# Patient Record
Sex: Female | Born: 1995 | Race: White | Hispanic: No | Marital: Single
Health system: Southern US, Community
[De-identification: ages and names within clinical notes are randomized; demographics above are authoritative.]

---

## 2021-02-20 ENCOUNTER — Other Ambulatory Visit: Payer: Self-pay

## 2021-02-20 ENCOUNTER — Emergency Department: Payer: BC Managed Care – PPO

## 2021-02-20 ENCOUNTER — Emergency Department
Admission: EM | Admit: 2021-02-20 | Discharge: 2021-02-20 | Disposition: A | Discharge status: Home / Self Care | Payer: BC Managed Care – PPO | Attending: Emergency Medicine | Admitting: Emergency Medicine

## 2021-02-20 DIAGNOSIS — S0990XA Unspecified injury of head, initial encounter: Secondary | ICD-10-CM | POA: Diagnosis present

## 2021-02-20 NOTE — Discharge Instructions (Addendum)
No acute findings on CT of the head and cervical spine.  Read and follow discharge care instruction for minor head injury.  Take Tylenol only for headache/pain.  Return right ED if condition worsens.

## 2021-02-20 NOTE — ED Provider Notes (Signed)
[facility] Emergency Department Provider Note   ____________________________________________   Event Date/Time   First MD Initiated Contact with Patient 02/20/21 1301     (approximate)  I have reviewed the triage vital signs and the nursing notes.   HISTORY  Chief Complaint Head Injury    HPI Veronica Craig is a 25 y.o. female patient presents with head injury after falling off a horse.  Witnessed by supervisors stated there was a brief episode of LOC.  Patient was confused initially after the fall.  Patient states now feels fine except for headache.  Denies vision disturbance or vertigo.  Patient landed on her side turned to her back when she fell off the horse.  Rates the pain as a 2/10.  Describes the pain as "sore".  No palliative measure for complaint.         History reviewed. No pertinent past medical history.  There are no problems to display for this patient.     Prior to Admission medications   Not on File    Allergies Patient has no known allergies.  History reviewed. No pertinent family history.  Social History    Review of Systems  Constitutional: No fever/chills Eyes: No visual changes. ENT: No sore throat. Cardiovascular: Denies chest pain. Respiratory: Denies shortness of breath. Gastrointestinal: No abdominal pain.  No nausea, no vomiting.  No diarrhea.  No constipation. Genitourinary: Negative for dysuria. Musculoskeletal: Negative for back pain. Skin: Negative for rash. Neurological: Positive for headaches, but denies focal weakness or numbness.   ____________________________________________   PHYSICAL EXAM:  VITAL SIGNS: ED Triage Vitals  Enc Vitals Group     BP 02/20/21 1155 112/84     Pulse Rate 02/20/21 1153 76     Resp 02/20/21 1155 18     Temp 02/20/21 1153 98.6 F (37 C)     Temp Source 02/20/21 1153 Oral     SpO2 02/20/21 1155 100 %     Weight 02/20/21 1154 145 lb (65.8 kg)     Height  02/20/21 1154 5\' 5"  (1.651 m)     Head Circumference --      Peak Flow --      Pain Score 02/20/21 1153 2     Pain Loc --      Pain Edu? --      Excl. in GC? --     Constitutional: Alert and oriented. Well appearing and in no acute distress. Eyes: Conjunctivae are normal. PERRL. EOMI. Head: Atraumatic. Nose: No congestion/rhinnorhea. Mouth/Throat: Mucous membranes are moist.  Oropharynx non-erythematous. Neck: No stridor.  No cervical spine tenderness to palpation. Hematological/Lymphatic/Immunilogical: No cervical lymphadenopathy. Cardiovascular: Normal rate, regular rhythm. Grossly normal heart sounds.  Good peripheral circulation. Respiratory: Normal respiratory effort.  No retractions. Lungs CTAB. Gastrointestinal: Soft and nontender. No distention. No abdominal bruits. No CVA tenderness. Genitourinary: Deferred Musculoskeletal: No lower extremity tenderness nor edema.  No joint effusions. Neurologic:  Normal speech and language. No gross focal neurologic deficits are appreciated. No gait instability. Skin:  Skin is warm, dry and intact. No rash noted.  No abrasion or ecchymosis. Psychiatric: Mood and affect are normal. Speech and behavior are normal.  ____________________________________________   LABS (all labs ordered are listed, but only abnormal results are displayed)  Labs Reviewed - No data to display ____________________________________________  EKG   ____________________________________________  RADIOLOGY I, 04/22/21, personally viewed and evaluated these images (plain radiographs) as part of my medical decision making, as well as reviewing  the written report by the radiologist.  ED MD interpretation:    Official radiology report(s): CT HEAD WO CONTRAST ( )  Result Date: 02/20/2021 CLINICAL DATA:  Polytrauma, critical, head/C-spine injury suspected EXAM: CT HEAD WITHOUT CONTRAST TECHNIQUE: Contiguous axial images were obtained from the base of the  skull through the vertex without intravenous contrast. COMPARISON:  None. FINDINGS: Brain: No evidence of acute intracranial hemorrhage or extra-axial collection.No evidence of mass lesion/concern mass effect.The ventricles are normal in size. Vascular: No hyperdense vessel or unexpected calcification. Skull: Normal. Negative for fracture or focal lesion. Sinuses/Orbits: No acute finding. Other: None. IMPRESSION: No acute intracranial abnormality. Electronically Signed   By: Caprice Renshaw M.D.   On: 02/20/2021 14:04   CT Cervical Spine Wo Contrast  Result Date: 02/20/2021 CLINICAL DATA:  Polytrauma, critical, head/C-spine injury suspected EXAM: CT CERVICAL SPINE WITHOUT CONTRAST TECHNIQUE: Multidetector CT imaging of the cervical spine was performed without intravenous contrast. Multiplanar CT image reconstructions were also generated. COMPARISON:  None. FINDINGS: Alignment: Normal. Skull base and vertebrae: No acute fracture. No primary bone lesion or focal pathologic process. Soft tissues and spinal canal: No prevertebral fluid or swelling. No visible canal hematoma. Disc levels: Preserved disc spaces.No large disc herniation, significant spinal canal or neuroforaminal narrowing. Upper chest: Negative. Other: None. IMPRESSION: No acute cervical spine fracture. Electronically Signed   By: Caprice Renshaw M.D.   On: 02/20/2021 14:08    ____________________________________________   PROCEDURES  Procedure(s) performed (including Critical Care):  Procedures   ____________________________________________   INITIAL IMPRESSION / ASSESSMENT AND PLAN / ED COURSE  As part of my medical decision making, I reviewed the following data within the electronic MEDICAL RECORD NUMBER         Patient presents evaluation secondary to fall from horse for brief episode of LOC.  Discussed no acute findings on CT of the head and cervical spine.  Patient complaint physical exam consistent with minor head injury secondary to  fall.  Patient given discharge care instructions and advised take only Tylenol as needed for headache/pain.  Return to ED if condition worsens.      ____________________________________________   FINAL CLINICAL IMPRESSION(S) / ED DIAGNOSES  Final diagnoses:  Minor head injury, initial encounter     ED Discharge Orders     None        Note:  This document was prepared using Dragon voice recognition software and may include unintentional dictation errors.    Joni Reining, PA-C 02/20/21 1426    Arnaldo Natal, MD 02/20/21 1623

## 2021-02-20 NOTE — ED Triage Notes (Signed)
Pt comes pov with head injury after falling off horse. Witnessed thinks very brief episode of LOC, but pt able to walk and answer questions afterwards with some confusion. Pt now Aox4, ambulatory. States some fuzziness after fall but then becomes clear again.

## 2022-10-14 IMAGING — CT CT CERVICAL SPINE W/O CM
3 of 4 series · 12 of 33 positions shown, 14 images · non-contrast
Comparison: None.

CLINICAL DATA: Polytrauma, critical, head/C-spine injury suspected

EXAM:
CT CERVICAL SPINE WITHOUT CONTRAST
TECHNIQUE: Multidetector CT imaging of the cervical spine was performed without
intravenous contrast. Multiplanar CT image reconstructions were also
generated.

[Series 6: sagittal bone · sagittal · 0.32mm/px · 5 of 61 slices shown, 6 images]
[im 21/61  bone]
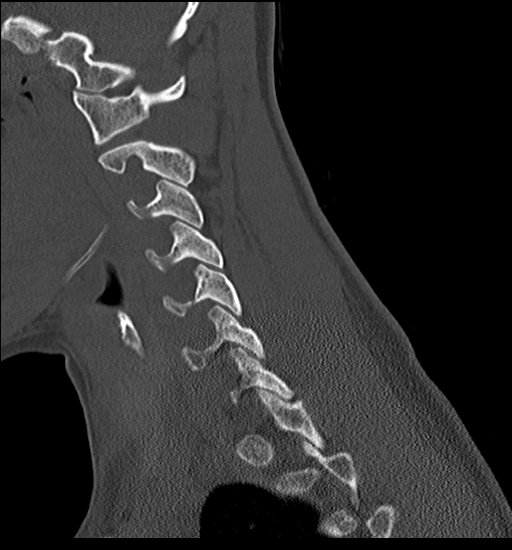
[im 26/61  bone]
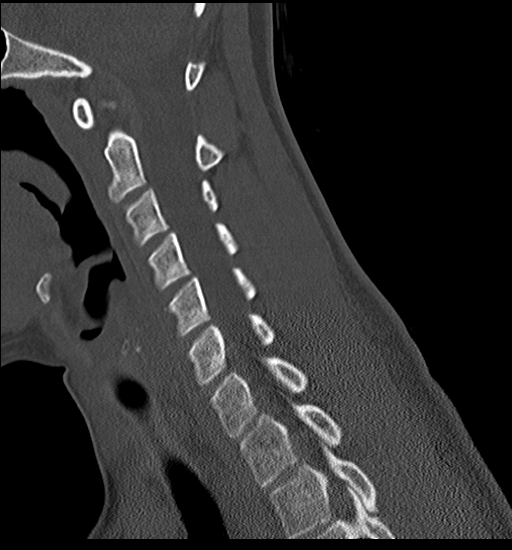
[im 31/61  soft-tissue]
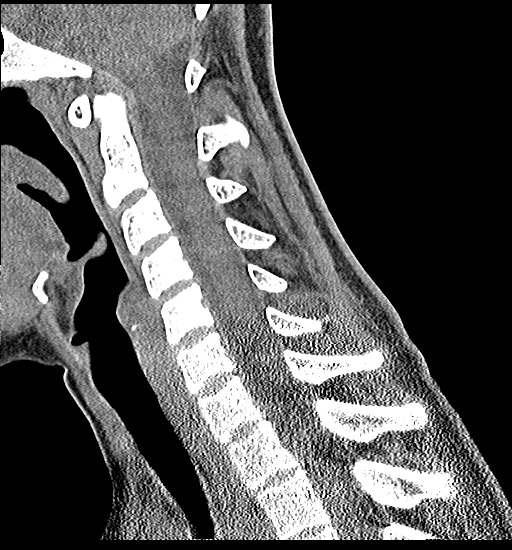
[im 31/61  bone]
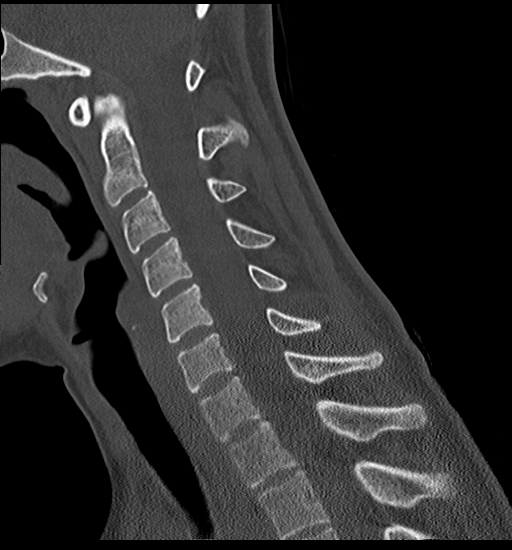
[im 36/61  bone]
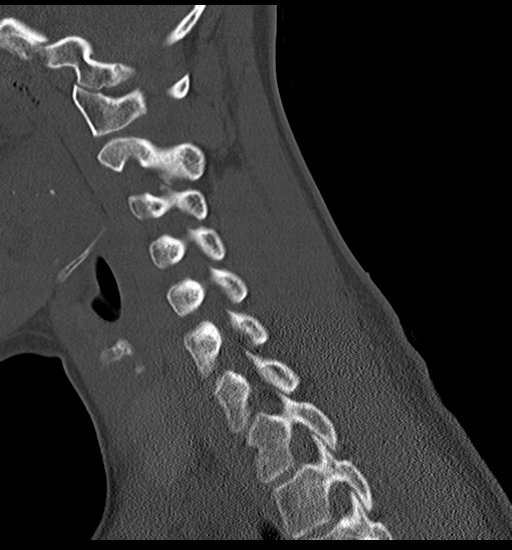
[im 41/61  bone]
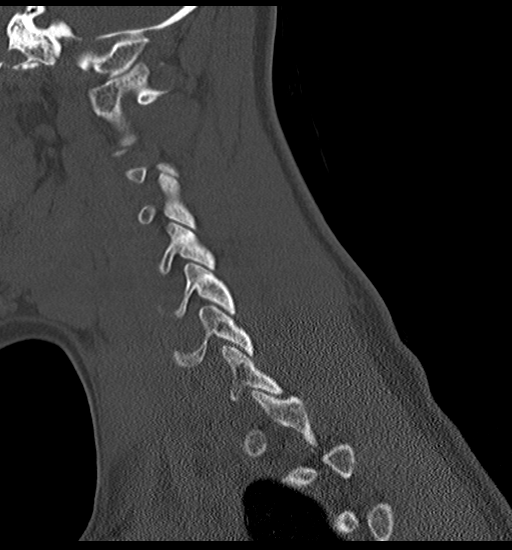

[Series 7: coronal bone · coronal · 0.27mm/px · 3 of 50 slices shown]
[im 10/50  bone]
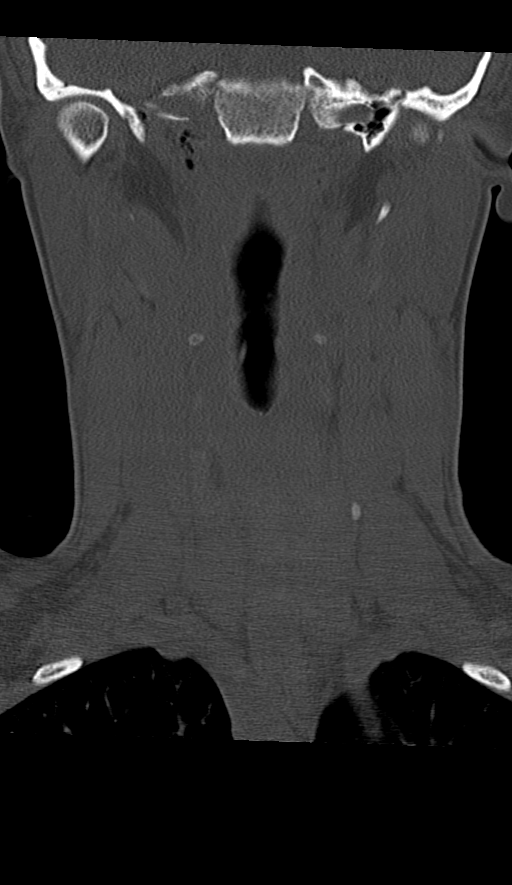
[im 20/50  bone]
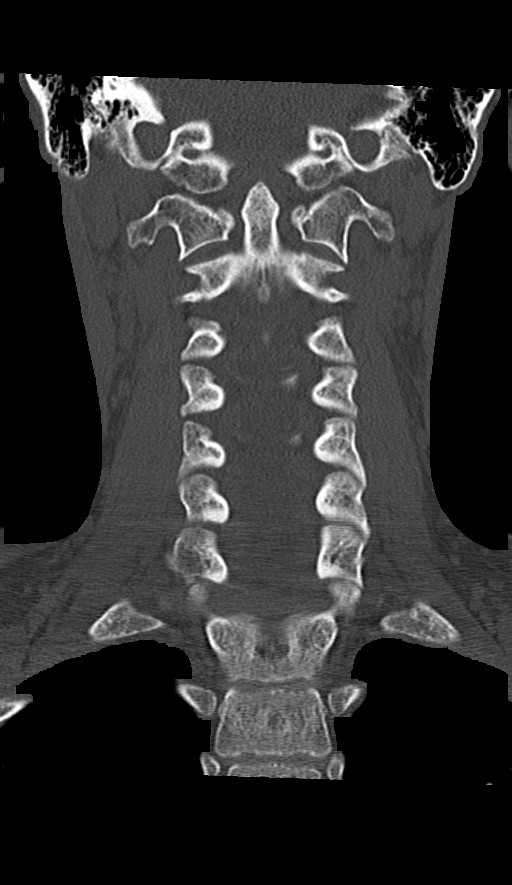
[im 30/50  bone]
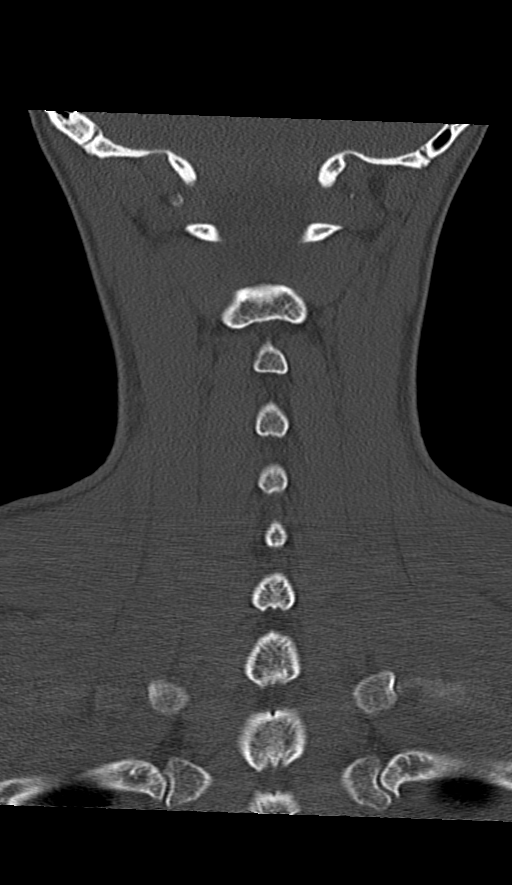

[Series 8: orthogonal bone · axial · 0.19mm/px · z∈[-312,-159]mm · 4 of 121 slices shown, 5 images]
[im 18/121  soft-tissue]
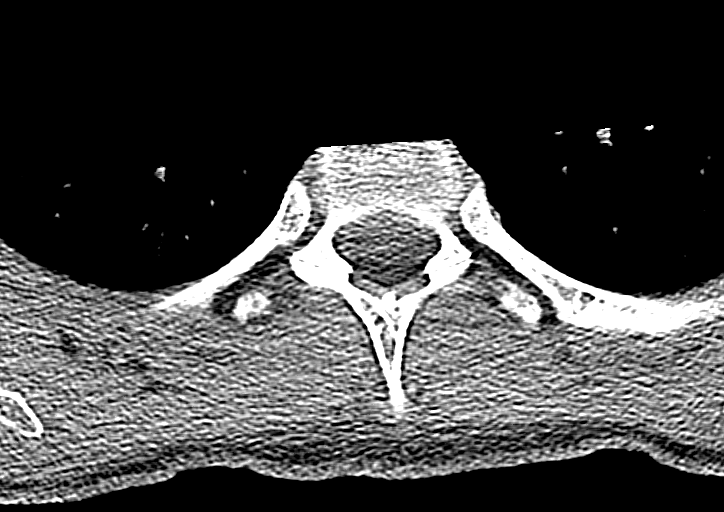
[im 18/121  bone]
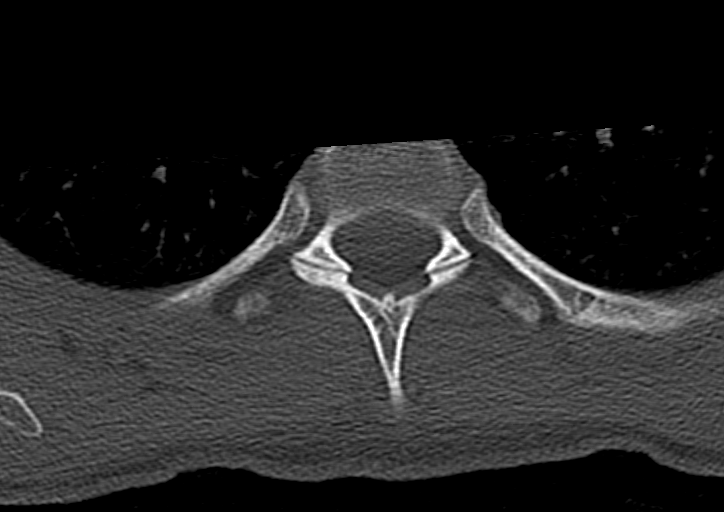
[im 52/121  bone]
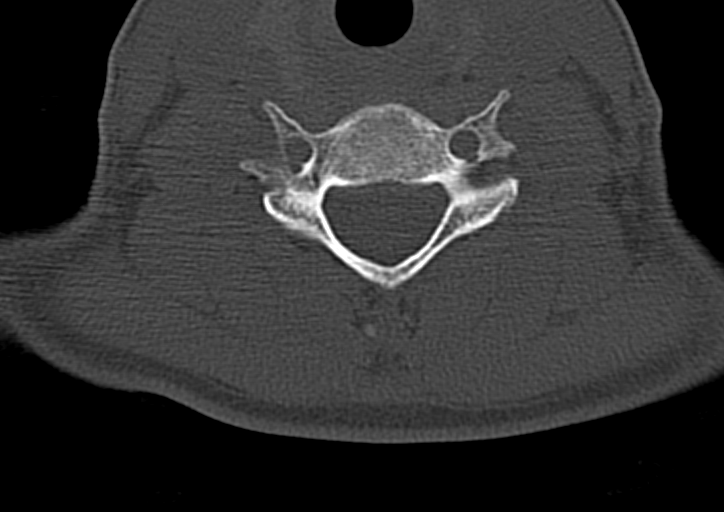
[im 69/121  bone]
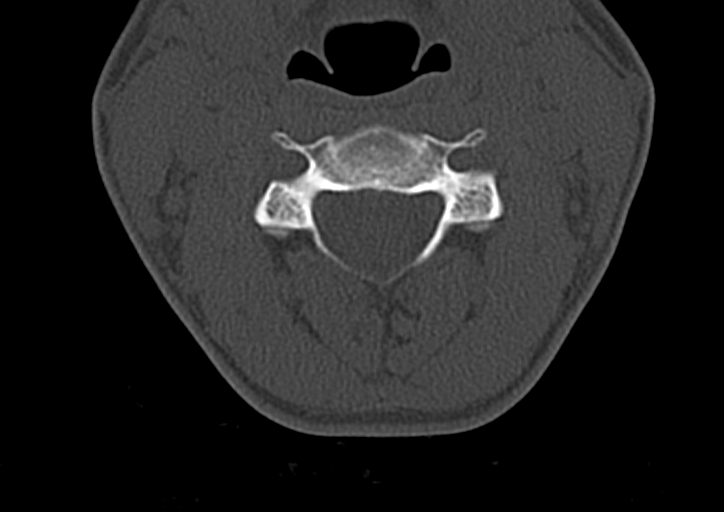
[im 103/121  bone]
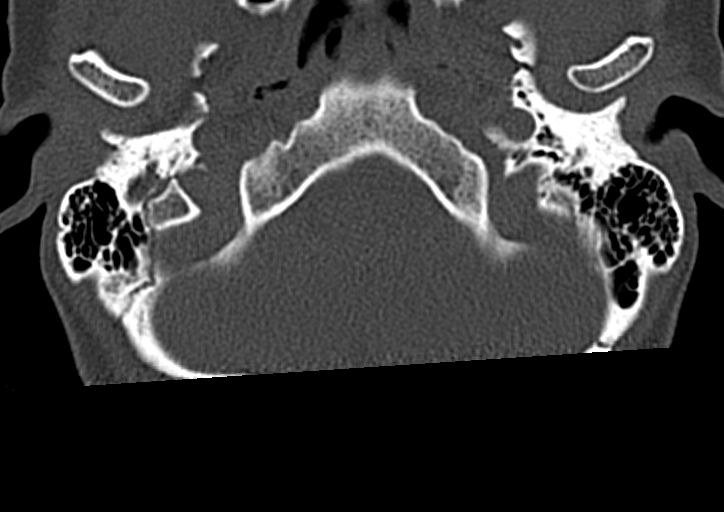

[12 of 33 positions shown; findings below may reference images not displayed]

FINDINGS: Alignment: Normal.

Skull base and vertebrae: No acute fracture. No primary bone lesion
or focal pathologic process.

Soft tissues and spinal canal: No prevertebral fluid or swelling. No
visible canal hematoma.

Disc levels: Preserved disc spaces.No large disc herniation,
significant spinal canal or neuroforaminal narrowing.

Upper chest: Negative.

Other: None.
IMPRESSION: No acute cervical spine fracture.
